# Patient Record
Sex: Male | Born: 1985 | Race: White | Hispanic: No | Marital: Single | Smoking: Former smoker
Health system: Southern US, Community
[De-identification: ages and names within clinical notes are randomized; demographics above are authoritative.]

---

## 2006-12-07 HISTORY — PX: KNEE ARTHROSCOPY W/ MENISCAL REPAIR: SHX1877

## 2012-08-27 ENCOUNTER — Emergency Department (HOSPITAL_COMMUNITY): Payer: BC Managed Care – PPO

## 2012-08-27 ENCOUNTER — Emergency Department (HOSPITAL_COMMUNITY)
Admission: EM | Admit: 2012-08-27 | Discharge: 2012-08-27 | Disposition: A | Discharge status: Home / Self Care | Payer: BC Managed Care – PPO | Attending: Emergency Medicine | Admitting: Emergency Medicine

## 2012-08-27 ENCOUNTER — Encounter (HOSPITAL_COMMUNITY): Payer: Self-pay | Admitting: Emergency Medicine

## 2012-08-27 DIAGNOSIS — Z87891 Personal history of nicotine dependence: Secondary | ICD-10-CM | POA: Insufficient documentation

## 2012-08-27 DIAGNOSIS — M25561 Pain in right knee: Secondary | ICD-10-CM

## 2012-08-27 DIAGNOSIS — M1710 Unilateral primary osteoarthritis, unspecified knee: Secondary | ICD-10-CM | POA: Insufficient documentation

## 2012-08-27 LAB — CBC WITH DIFFERENTIAL/PLATELET
Basophils Absolute: 0.1 10*3/uL (ref 0.0–0.1)
Eosinophils Absolute: 0.1 10*3/uL (ref 0.0–0.7)
Eosinophils Relative: 1 % (ref 0–5)
HCT: 39.4 % (ref 39.0–52.0)
Lymphocytes Relative: 26 % (ref 12–46)
MCH: 29.3 pg (ref 26.0–34.0)
MCHC: 33.8 g/dL (ref 30.0–36.0)
MCV: 86.8 fL (ref 78.0–100.0)
Monocytes Absolute: 0.8 10*3/uL (ref 0.1–1.0)
Platelets: 298 10*3/uL (ref 150–400)
RDW: 12.1 % (ref 11.5–15.5)
WBC: 7.8 10*3/uL (ref 4.0–10.5)

## 2012-08-27 NOTE — ED Notes (Signed)
Pt presents w/ left knee pain onset 2 wks ago. Pain has gotten worse and knee is now swollen. Denies new injury but is active on his legs. Pt endorses hx of left knee infection 2.5 yrs ago Dx at student center at [employer], specimen was lost, treated like it was "STD". Pt was hospitalized at [facility] for 1 wk and then received home IV antibx after discharge.States recently tested for STDs and was "clear"

## 2012-08-27 NOTE — ED Notes (Signed)
Pt states he has pain that comes from L lateral thigh and "shoots into my groin". Pt states at times the pain moves from calf to thigh to groin and feels "tight". Denies recent injury or falls, + pedal and femoral pulses, no redness or heat to extremity.

## 2012-08-28 NOTE — ED Provider Notes (Signed)
History     CSN: [id number]  Arrival date & time 08/27/12  1613   First MD Initiated Contact with Patient 08/27/12 1824      Chief Complaint  Patient presents with  . Knee Pain    (Consider location/radiation/quality/duration/timing/severity/associated sxs/prior treatment) HPI  26 year old male in no acute distress complaining of left knee pain worsening over the course of 2 weeks. He denies any injury but he is a very active and athletic person. Patient had a similar episode 2 and half years ago at that time, the pain was accompanied by conjunctivitis and urethral discharge. Patient was never diagnosed with a septic arthritis, however he was treated for STDs at that time. Patient currently denies any fever, conjunctivitis or urethral discharge, any recent history of unprotected sex. Patient states he was tested for STDs at his school approximately one half weeks ago when everything came back negative. Pain is moderate at 6/10, exacerbated by weightbearing, non-radiating. Patient also had arthroscopy with meniscal repair in 2008 at the same knee.  History reviewed. No pertinent past medical history.  Past Surgical History  Procedure Date  . Knee arthroscopy w/ meniscal repair 2008    left knee    No family history on file.  History  Substance Use Topics  . Smoking status: Former Games developer  . Smokeless tobacco: Former Neurosurgeon    Types: Chew  . Alcohol Use: 2.4 oz/week    4 Cans of beer per week      Review of Systems  Constitutional: Negative for fever.  Eyes: Negative for discharge, redness and itching.  Respiratory: Negative for shortness of breath.   Cardiovascular: Negative for chest pain.  Gastrointestinal: Negative for nausea, vomiting, abdominal pain and diarrhea.  Genitourinary: Negative for dysuria, frequency, discharge, penile swelling, genital sores, penile pain and testicular pain.  Musculoskeletal: Positive for arthralgias.  All other systems reviewed and are  negative.    Allergies  Pertussis vaccines  Home Medications  No current outpatient prescriptions on file.  BP 120/56  Pulse 56  Temp 99.3 F (37.4 C) (Oral)  Resp 16  SpO2 100%  Physical Exam  Nursing note and vitals reviewed. Constitutional: He is oriented to person, place, and time. He appears well-developed and well-nourished. No distress.  HENT:  Head: Normocephalic.  Mouth/Throat: Oropharynx is clear and moist.  Eyes: Conjunctivae normal and EOM are normal.  Cardiovascular: Normal rate and intact distal pulses.   Pulmonary/Chest: Effort normal and breath sounds normal. No stridor.  Abdominal: Soft. Bowel sounds are normal.  Musculoskeletal: Normal range of motion.       Left Knee: No deformity, no warmth, erythema or abrasions. FROM. No effusion or crepitance. Anterior and posterior drawer show no abnormal laxity. Stable to valgus and varus stress. Joint lines are non-tender. Neurovascularly intact. Pt ambulates with non-antalgic gait.    Neurological: He is alert and oriented to person, place, and time.  Psychiatric: He has a normal mood and affect.    ED Course  Procedures (including critical care time)   Labs Reviewed  CBC WITH DIFFERENTIAL  LAB REPORT - SCANNED   Dg Knee Complete 4 Views Left  08/27/2012  *RADIOLOGY REPORT*  Clinical Data: Knee pain  LEFT KNEE - COMPLETE 4+ VIEW  Comparison: None.  Findings: Five views of the left knee submitted.  No acute fracture or subluxation.  No radiopaque foreign body.  No joint effusion.  IMPRESSION: No acute fracture or subluxation.   Original Report Authenticated By: Natasha Mead, M.D.  1. Arthralgia of knee, right       MDM  Very unlikely septic arthritis: Negative STD screen, knee is not warm no significant effusion is noted, no white blood cell count.    Patient has primary care at he'll family practice will instruct him to follow with her. Discussed case with attending Dr. Effie Shy who agrees with care plan  and stability for discharge   Pt verbalized understanding and agrees with care plan. Outpatient follow-up and return precautions given.        Joni Reining Deroy Noah, PA-C 08/29/12 0009

## 2012-08-30 NOTE — ED Provider Notes (Signed)
Medical screening examination/treatment/procedure(s) were performed by non-physician practitioner and as supervising physician I was immediately available for consultation/collaboration.  Zoe Goonan L Alecxis Baltzell, MD 08/30/12 2044 

## 2018-08-03 ENCOUNTER — Other Ambulatory Visit: Payer: Self-pay

## 2018-08-03 ENCOUNTER — Emergency Department (HOSPITAL_COMMUNITY)
Admission: EM | Admit: 2018-08-03 | Discharge: 2018-08-03 | Disposition: A | Discharge status: Home / Self Care | Payer: PRIVATE HEALTH INSURANCE | Attending: Emergency Medicine | Admitting: Emergency Medicine

## 2018-08-03 ENCOUNTER — Encounter (HOSPITAL_COMMUNITY): Payer: Self-pay | Admitting: *Deleted

## 2018-08-03 ENCOUNTER — Emergency Department (HOSPITAL_COMMUNITY): Payer: PRIVATE HEALTH INSURANCE

## 2018-08-03 DIAGNOSIS — Y33XXXA Other specified events, undetermined intent, initial encounter: Secondary | ICD-10-CM | POA: Diagnosis not present

## 2018-08-03 DIAGNOSIS — Y998 Other external cause status: Secondary | ICD-10-CM | POA: Insufficient documentation

## 2018-08-03 DIAGNOSIS — S93401A Sprain of unspecified ligament of right ankle, initial encounter: Secondary | ICD-10-CM | POA: Diagnosis not present

## 2018-08-03 DIAGNOSIS — Z87891 Personal history of nicotine dependence: Secondary | ICD-10-CM | POA: Diagnosis not present

## 2018-08-03 DIAGNOSIS — Y939 Activity, unspecified: Secondary | ICD-10-CM | POA: Diagnosis not present

## 2018-08-03 DIAGNOSIS — S99911A Unspecified injury of right ankle, initial encounter: Secondary | ICD-10-CM | POA: Diagnosis present

## 2018-08-03 DIAGNOSIS — Y929 Unspecified place or not applicable: Secondary | ICD-10-CM | POA: Insufficient documentation

## 2018-08-03 MED ORDER — OXYCODONE-ACETAMINOPHEN 5-325 MG PO TABS
1.0000 | ORAL_TABLET | Freq: Once | ORAL | Status: AC
  Administered 2018-08-03: 1 via ORAL
  Filled 2018-08-03: qty 1

## 2018-08-03 NOTE — ED Triage Notes (Signed)
Last Friday a week ago rolled ankle, had x rays  Due to not being able to walk the next day. Wa told to take Ibuprofen, not getting any better.

## 2018-08-03 NOTE — ED Notes (Signed)
Bed: WTR5 Expected date:  Expected time:  Means of arrival:  Comments: 

## 2018-08-03 NOTE — ED Provider Notes (Signed)
[facility]-EMERGENCY DEPT Provider Note   CSN: [id number] Arrival date & time: 08/03/18  1119     History   Chief Complaint Chief Complaint  Patient presents with  . Ankle Pain    Rt    HPI Shirlee LatchLuke T Caloca is a 32 y.o. male.  HPI Patient presents with right ankle and foot pain.  Around 5 days ago he rolled his right ankle.  States he does not remember exactly what happened.  [facility] that was [facility] negative.  Has had continued pain.  Swelling is improved somewhat but continued pain is having difficulty walking on it.  States he went back to work and the pain increased.  No other injury.  Patient wonders about an MRI for his ankle. History reviewed. No pertinent past medical history.  There are no active problems to display for this patient.   Past Surgical History:  Procedure Laterality Date  . KNEE ARTHROSCOPY W/ MENISCAL REPAIR  2008   left knee        Home Medications    Prior to Admission medications   Medication Sig Start Date End Date Taking? Authorizing Provider  ibuprofen (ADVIL,MOTRIN) 200 MG tablet Take 200-400 mg by mouth every 6 (six) hours as needed for mild pain.   Yes [provider]  OVER THE COUNTER MEDICATION Take 1 capsule by mouth daily as needed (cat allergies).   Yes [provider]    Family History No family history on file.  Social History Social History   Tobacco Use  . Smoking status: Former Games developermoker  . Smokeless tobacco: Former NeurosurgeonUser    Types: Chew  Substance Use Topics  . Alcohol use: Yes    Alcohol/week: 4.0 standard drinks    Types: 4 Cans of beer per week  . Drug use: Yes    Frequency: 1.0 times per week    Types: Marijuana     Allergies   Pertussis vaccines   Review of Systems Review of Systems  Constitutional: Negative for appetite change.  Musculoskeletal:       Right ankle pain and swelling.  Skin: Negative for wound.  Neurological: Negative for  weakness and numbness.     Physical Exam Updated Vital Signs BP (!) 130/93 (BP Location: Left Arm)   Pulse (!) 103   Temp 98.2 F (36.8 C) (Oral)   Resp 16   Ht 5\' 7"  (1.702 m)   Wt 77.1 kg   SpO2 100%   BMI 26.63 kg/m   Physical Exam  Constitutional: He appears well-developed.  HENT:  Head: Atraumatic.  Cardiovascular: Normal rate.  Musculoskeletal: He exhibits tenderness.  Tenderness over right ankle medially.  Tender over medial malleolus with some swelling inferior to this.  No tenderness over proximal lower leg.  Skin intact.  Sensation intact distally.  Ankle appears stable.  Mild tenderness over midfoot.  Neurological: He is alert.     ED Treatments / Results  Labs (all labs ordered are listed, but only abnormal results are displayed) Labs Reviewed - No data to display  EKG None  Radiology Dg Ankle Complete Right  Result Date: 08/03/2018 CLINICAL DATA:  RIGHT foot and ankle injury approximately 1 week ago, persistent pain and swelling unrelieved with ibuprofen. Subsequent encounter. EXAM: RIGHT ANKLE - COMPLETE 3+ VIEW COMPARISON:  None. FINDINGS: Mild diffuse soft tissue swelling. No evidence of acute or subacute fracture. Ankle mortise intact with well-preserved joint space. Well-preserved bone mineral density. Benign bone  island in the talus. Small joint effusion. IMPRESSION: No acute or or subacute osseous abnormality.  Small joint effusion. Electronically Signed   By: Hulan Saas M.D.   On: 08/03/2018 12:32   Dg Foot Complete Right  Result Date: 08/03/2018 CLINICAL DATA:  RIGHT foot and ankle injury approximately 1 week ago, persistent pain and swelling unrelieved with ibuprofen. Subsequent encounter. EXAM: RIGHT FOOT COMPLETE - 3+ VIEW COMPARISON:  None. FINDINGS: Possible subacute or remote avulsion fracture arising from the head of the proximal phalanx of the fifth toe. No fractures elsewhere. Well-preserved joint spaces. Well-preserved bone mineral  density. Small plantar calcaneal spur. IMPRESSION: Possible subacute or remote avulsion fracture arising from the head of the proximal phalanx of the fifth toe. Please correlate with point tenderness. No fractures elsewhere. Small plantar calcaneal spur. Electronically Signed   By: Hulan Saas M.D.   On: 08/03/2018 12:35    Procedures Procedures (including critical care time)  Medications Ordered in ED Medications  oxyCODONE-acetaminophen (PERCOCET/ROXICET) 5-325 MG per tablet 1 tablet (1 tablet Oral Given 08/03/18 1339)     Initial Impression / Assessment and Plan / ED Course  I have reviewed the triage vital signs and the nursing notes.  Pertinent labs & imaging results that were available during my care of the patient were reviewed by me and considered in my medical decision making (see chart for details).     Patient with a right ankle injury.  Reported negative x-ray but with continued pain after a week it was repeated.  Still no bony injury seen.  Patient has a splint/immobilizer at home.  Given crutches and will follow-up with orthopedic surgery.  Final Clinical Impressions(s) / ED Diagnoses   Final diagnoses:  Sprain of right ankle, unspecified ligament, initial encounter    ED Discharge Orders    None       Benjiman Core, MD 08/04/18 1557

## 2018-08-11 ENCOUNTER — Encounter (INDEPENDENT_AMBULATORY_CARE_PROVIDER_SITE_OTHER): Payer: Self-pay | Admitting: Orthopaedic Surgery

## 2018-08-11 ENCOUNTER — Ambulatory Visit (INDEPENDENT_AMBULATORY_CARE_PROVIDER_SITE_OTHER): Payer: PRIVATE HEALTH INSURANCE | Admitting: Orthopaedic Surgery

## 2018-08-11 DIAGNOSIS — S93411A Sprain of calcaneofibular ligament of right ankle, initial encounter: Secondary | ICD-10-CM | POA: Diagnosis not present

## 2018-08-11 NOTE — Progress Notes (Signed)
Office Visit Note   Patient: Devon Tanner           Date of Birth: 03/04/1986           MRN: [id number] Visit Date: 08/11/2018              Requested by: No referring provider defined for this encounter. PCP: Patient, No Pcp Per   Assessment & Plan: Visit Diagnoses:  1. Sprain of calcaneofibular ligament of right ankle, initial encounter     Plan: The patient understands this is quite a severe sprain and can take 4 to 6 weeks before he feels better.  He is to work on ice and elevation and can weight-bear as tolerated using his brace or boot.  We will give him a note to keep him out of work at least the next 2 weeks.  I would like to see him back in 2 weeks for repeat exam.  If his symptoms not improving and he has significant pain on exam we may consider repeat 3 views of his right ankle.  All question concerns were answered and addressed.  Follow-Up Instructions: Return in about 2 weeks (around 08/25/2018).   Orders:  No orders of the defined types were placed in this encounter.  No orders of the defined types were placed in this encounter.     Procedures: No procedures performed   Clinical Data: No additional findings.   Subjective: Chief Complaint  Patient presents with  . Right Ankle - Pain  The patient is a 32 year old gentleman referred to the emergency room after sustaining a sprain to his right ankle about 2 weeks ago.  He rolled on that ankle and his pain worsened over the weekend.  He had difficulty ambulating on the ankle as well with swelling medially and laterally.  In the emergency room x-rays were obtained were negative for fracture.  He does have 2 different types of braces is on crutches.  He does work at [employer] is on his feet quite a bit he cannot do that job right now.  Denies any numbness and tingling or any injuries otherwise.  His father is with him today.  HPI  Review of Systems He currently denies any headache, chest pain, shortness of  breath, fever, chills, nausea, vomiting.  Objective: Vital Signs: There were no vitals taken for this visit.  Physical Exam He is alert and oriented x3 and in no acute distress Ortho Exam Examination of his right ankle shows that is clinically well located.  There is lateral and medial swelling but no bruising.  His foot is neurovascular intact.  He has pain over the anterior talofibular ligament as well as the deltoid ligament. Specialty Comments:  No specialty comments available.  Imaging: No results found. X-rays independently reviewed of the right ankle show no obvious fracture but soft tissue swelling medially and laterally.  The ankle mortise is intact.  PMFS History: There are no active problems to display for this patient.  History reviewed. No pertinent past medical history.  History reviewed. No pertinent family history.  Past Surgical History:  Procedure Laterality Date  . KNEE ARTHROSCOPY W/ MENISCAL REPAIR  2008   left knee   Social History   Occupational History  . Not on file  Tobacco Use  . Smoking status: Former Games developer  . Smokeless tobacco: Former Neurosurgeon    Types: Chew  Substance and Sexual Activity  . Alcohol use: Yes    Alcohol/week: 4.0 standard drinks  Types: 4 Cans of beer per week  . Drug use: Yes    Frequency: 1.0 times per week    Types: Marijuana  . Sexual activity: Yes    Birth control/protection: Condom

## 2018-08-25 ENCOUNTER — Encounter (INDEPENDENT_AMBULATORY_CARE_PROVIDER_SITE_OTHER): Payer: Self-pay | Admitting: Orthopaedic Surgery

## 2018-08-25 ENCOUNTER — Ambulatory Visit (INDEPENDENT_AMBULATORY_CARE_PROVIDER_SITE_OTHER): Payer: PRIVATE HEALTH INSURANCE

## 2018-08-25 ENCOUNTER — Ambulatory Visit (INDEPENDENT_AMBULATORY_CARE_PROVIDER_SITE_OTHER): Payer: PRIVATE HEALTH INSURANCE | Admitting: Orthopaedic Surgery

## 2018-08-25 DIAGNOSIS — S93411D Sprain of calcaneofibular ligament of right ankle, subsequent encounter: Secondary | ICD-10-CM

## 2018-08-25 NOTE — Progress Notes (Signed)
The patient is now between 4 and 5 weeks into a significant right ankle injury.  We felt this was just a sprain but he still hopping around on crutches and walking with a cam walker and sometimes an ASO.  He still experiencing global swelling in his right ankle as well.  He has had plain films before that were negative for fracture.  On exam he does have global tenderness around his right ankle with swelling medially and laterally and definitely a mild ankle effusion.  He has good range of motion of the ankle but is tender and stiff.  3 views of the right ankle today showed no gross abnormalities there is soft tissue swelling.  At this point based on the amount of swelling and especially the ankle fusion is having I am concerned about an osteochondral defect or a missed fracture.  At this point since is been 4 to 5 weeks an MRI is warranted to assess the cartilage and bones of the ankle as well as the tenderness and ligamentous structures.  I am going to give him a note to keep him out of work as well.  He will continue his crutches and weightbearing as tolerated in cam walking boot.  We will see him back once the MRI is obtained of his right ankle.  All question concerns were answered and addressed.

## 2018-08-29 ENCOUNTER — Other Ambulatory Visit (INDEPENDENT_AMBULATORY_CARE_PROVIDER_SITE_OTHER): Payer: Self-pay | Admitting: Orthopaedic Surgery

## 2018-08-29 DIAGNOSIS — S93411D Sprain of calcaneofibular ligament of right ankle, subsequent encounter: Secondary | ICD-10-CM

## 2018-09-08 ENCOUNTER — Ambulatory Visit (INDEPENDENT_AMBULATORY_CARE_PROVIDER_SITE_OTHER): Payer: PRIVATE HEALTH INSURANCE | Admitting: Orthopaedic Surgery

## 2018-09-10 ENCOUNTER — Ambulatory Visit
Admission: RE | Admit: 2018-09-10 | Discharge: 2018-09-10 | Disposition: A | Discharge status: Home / Self Care | Payer: PRIVATE HEALTH INSURANCE | Source: Ambulatory Visit | Attending: Orthopaedic Surgery | Admitting: Orthopaedic Surgery

## 2018-09-10 DIAGNOSIS — S93411D Sprain of calcaneofibular ligament of right ankle, subsequent encounter: Secondary | ICD-10-CM

## 2018-09-14 ENCOUNTER — Ambulatory Visit (INDEPENDENT_AMBULATORY_CARE_PROVIDER_SITE_OTHER): Payer: PRIVATE HEALTH INSURANCE | Admitting: Physician Assistant

## 2018-09-14 ENCOUNTER — Encounter (INDEPENDENT_AMBULATORY_CARE_PROVIDER_SITE_OTHER): Payer: Self-pay | Admitting: Physician Assistant

## 2018-09-14 VITALS — Ht 67.0 in | Wt 160.0 lb

## 2018-09-14 DIAGNOSIS — M25571 Pain in right ankle and joints of right foot: Secondary | ICD-10-CM

## 2018-09-14 NOTE — Progress Notes (Signed)
Office Visit Note   Patient: Devon Tanner           Date of Birth: 09-01-1986           MRN: [id number] Visit Date: 09/14/2018              Requested by: No referring provider defined for this encounter. PCP: Patient, No Pcp Per   Assessment & Plan: Visit Diagnoses: No diagnosis found.  Plan: Discussed with loop that this MRI mostly showed bony bruising ankle sprain and some tenosynovitis.  He does not need any type of surgical intervention at this point in time.  Recommend conservative treatment.  However discussed with him that this may take several months to totally resolve 3-6 in all.  I explained to the patient like for him to go into the ASO brace when up ambulating.  He is weightbearing as tolerated with the ASO brace on.  He is to work on range of motion of the ankle and foot.  Encouraged elevation and ice.  Follow-Up Instructions: Return in about 2 months (around 11/14/2018).   Orders:  No orders of the defined types were placed in this encounter.  No orders of the defined types were placed in this encounter.     Procedures: No procedures performed   Clinical Data: No additional findings.   Subjective: Chief Complaint  Patient presents with  . Right Ankle - Follow-up    MRI Right Ankle Review    HPI Devon Tanner returns today 6 weeks 5 days status post right ankle injury.  Is here to review the MRI of his ankle.  He states overall he is improving but continues to have sharp pain if he is standing and bearing weight on the foot and ankle.  He has pain in the posterior aspect of the heel and the anterior aspect of the ankle.  He is using crutches.  He is taken Tylenol.  He has had no shortness of breath fevers or chills. MRI of the right ankle is reviewed with the patient actual images are shown to the patient and the ankle model was used for further visualization.  Right ankle MRI dated 09/10/2018 showed marrow edema involving the neck of the talus as well as the posterior  medial navicular bone which could indicate a subacute nondisplaced fracture.  Marrow edema of the lateral and medial malleoli and also around the subtalar joint.  Mild tenosynovitis of the posterior medial peroneal and anterior tibial tendons.  Torn appearance of the anterior tibiofibular ligament however the tendon appears intact.  Bursitis retrocalcaneal region.  Nondisplaced fracture versus type II accessory navicular.  Review of Systems See HPI Objective: Vital Signs: Ht 5\' 7"  (1.702 m)   Wt 160 lb (72.6 kg)   BMI 25.06 kg/m   Physical Exam  Constitutional: He appears well-developed and well-nourished. No distress.  Cardiovascular: Intact distal pulses.  Neurological: He is alert.  Skin: He is not diaphoretic.    Ortho Exam Right ankle Global swelling.  He has tenderness over the anterior talofibular ligament region.  No tenderness over the posterior tibial tendon or peroneal tendons.  He has tenderness over the insertion region of the posterior tibial tendon at the end of the navicular region.  Has tenderness at the posterior aspect of the calcaneus no tenderness over the Achilles.  Calf supple nontender.  Medial tubercle calcaneus is non-tender.  He is able to invert and evert the foot without significant pain. Specialty Comments:  No specialty comments available.  Imaging: No results found.   PMFS History: There are no active problems to display for this patient.  No past medical history on file.  No family history on file.  Past Surgical History:  Procedure Laterality Date  . KNEE ARTHROSCOPY W/ MENISCAL REPAIR  2008   left knee   Social History   Occupational History  . Not on file  Tobacco Use  . Smoking status: Former Games developer  . Smokeless tobacco: Former Neurosurgeon    Types: Chew  Substance and Sexual Activity  . Alcohol use: Yes    Alcohol/week: 4.0 standard drinks    Types: 4 Cans of beer per week  . Drug use: Yes    Frequency: 1.0 times per week    Types:  Marijuana  . Sexual activity: Yes    Birth control/protection: Condom

## 2018-11-14 ENCOUNTER — Ambulatory Visit (INDEPENDENT_AMBULATORY_CARE_PROVIDER_SITE_OTHER): Payer: PRIVATE HEALTH INSURANCE | Admitting: Physician Assistant

## 2018-12-12 ENCOUNTER — Emergency Department (HOSPITAL_COMMUNITY)
Admission: EM | Admit: 2018-12-12 | Discharge: 2018-12-12 | Disposition: A | Discharge status: Home / Self Care | Payer: PRIVATE HEALTH INSURANCE | Attending: Emergency Medicine | Admitting: Emergency Medicine

## 2018-12-12 ENCOUNTER — Encounter (HOSPITAL_COMMUNITY): Payer: Self-pay | Admitting: Emergency Medicine

## 2018-12-12 DIAGNOSIS — Z79899 Other long term (current) drug therapy: Secondary | ICD-10-CM | POA: Insufficient documentation

## 2018-12-12 DIAGNOSIS — R05 Cough: Secondary | ICD-10-CM | POA: Diagnosis present

## 2018-12-12 DIAGNOSIS — J09X2 Influenza due to identified novel influenza A virus with other respiratory manifestations: Secondary | ICD-10-CM | POA: Insufficient documentation

## 2018-12-12 DIAGNOSIS — J101 Influenza due to other identified influenza virus with other respiratory manifestations: Secondary | ICD-10-CM

## 2018-12-12 DIAGNOSIS — Z87891 Personal history of nicotine dependence: Secondary | ICD-10-CM | POA: Insufficient documentation

## 2018-12-12 LAB — INFLUENZA PANEL BY PCR (TYPE A & B)
INFLBPCR: NEGATIVE
Influenza A By PCR: POSITIVE — AB

## 2018-12-12 MED ORDER — FLUTICASONE PROPIONATE 50 MCG/ACT NA SUSP: 1.0000 | Freq: Every day | NASAL | 2 refills | Status: AC

## 2018-12-12 MED ORDER — BENZONATATE 100 MG PO CAPS: 100.0000 mg | ORAL_CAPSULE | Freq: Three times a day (TID) | ORAL | 0 refills | Status: AC

## 2018-12-12 MED ORDER — OSELTAMIVIR PHOSPHATE 75 MG PO CAPS: 75.0000 mg | ORAL_CAPSULE | Freq: Two times a day (BID) | ORAL | 0 refills | Status: AC

## 2018-12-12 NOTE — ED Triage Notes (Signed)
Pt c/o body aches, sore throat, cough, ear pains and congestion for 3 days.

## 2018-12-12 NOTE — Discharge Instructions (Signed)
We are treating you for influenza A. Be sure you are drinking plenty of fluids so you don't get dehydrated. Follow up with your doctor or return here for worsening symptoms.

## 2018-12-12 NOTE — ED Provider Notes (Signed)
[facility]-EMERGENCY DEPT Provider Note   CSN: [id number] Arrival date & time: 12/12/18  1524     History   Chief Complaint Chief Complaint  Patient presents with  . Generalized Body Aches  . Cough  . Sore Throat  . Otalgia    HPI Devon Tanner is a 33 y.o. male who presents to the ED with c/o flu like symptoms that started 2 days ago. Patient did not have flu shot this year.   The history is provided by the patient. No language interpreter was used.  Sore Throat  Associated symptoms include headaches. Pertinent negatives include no abdominal pain. Chest pain: with cough.  Otalgia  Associated symptoms: congestion, cough, fever, headaches and sore throat   Associated symptoms: no abdominal pain, no diarrhea, no rash and no vomiting   Influenza  Presenting symptoms: cough, fatigue, fever, headache, myalgias and sore throat   Presenting symptoms: no diarrhea and no vomiting   Severity:  Moderate Onset quality:  Gradual Duration:  2 days Progression:  Unchanged Relieved by:  Nothing Worsened by:  Nothing Ineffective treatments:  OTC medications Associated symptoms: chills, decreased appetite, ear pain and nasal congestion   Associated symptoms: no neck stiffness and no syncope   Risk factors: no sick contacts     History reviewed. No pertinent past medical history.  There are no active problems to display for this patient.   Past Surgical History:  Procedure Laterality Date  . KNEE ARTHROSCOPY W/ MENISCAL REPAIR  2008   left knee        Home Medications    Prior to Admission medications   Medication Sig Start Date End Date Taking? Authorizing Provider  acetaminophen (TYLENOL) 500 MG tablet Take 500 mg by mouth daily as needed.    [provider]  benzonatate (TESSALON) 100 MG capsule Take 1 capsule (100 mg total) by mouth every 8 (eight) hours. 12/12/18   Janne Napoleon, NP  fluticasone (FLONASE) 50 MCG/ACT nasal spray Place 1 spray  into both nostrils daily. 12/12/18   Janne Napoleon, NP  ibuprofen (ADVIL,MOTRIN) 200 MG tablet Take 200-400 mg by mouth every 6 (six) hours as needed for mild pain.    [provider]  oseltamivir (TAMIFLU) 75 MG capsule Take 1 capsule (75 mg total) by mouth every 12 (twelve) hours. 12/12/18   Janne Napoleon, NP  OVER THE COUNTER MEDICATION Take 1 capsule by mouth daily as needed (cat allergies).    [provider]    Family History No family history on file.  Social History Social History   Tobacco Use  . Smoking status: Former Games developer  . Smokeless tobacco: Former Neurosurgeon    Types: Chew  Substance Use Topics  . Alcohol use: Yes    Alcohol/week: 4.0 standard drinks    Types: 4 Cans of beer per week  . Drug use: Yes    Frequency: 1.0 times per week    Types: Marijuana     Allergies   Pertussis vaccines   Review of Systems Review of Systems  Constitutional: Positive for chills, decreased appetite, fatigue and fever.  HENT: Positive for congestion, ear pain, sinus pressure and sore throat.   Eyes: Negative for discharge, redness and itching.  Respiratory: Positive for cough.   Cardiovascular: Chest pain: with cough.  Gastrointestinal: Negative for abdominal pain, diarrhea and vomiting.  Genitourinary: Negative for dysuria, frequency and urgency.  Musculoskeletal: Positive for myalgias. Negative for neck stiffness.  Skin: Negative for  rash.  Neurological: Positive for headaches. Negative for syncope.  Psychiatric/Behavioral: Negative for confusion.     Physical Exam Updated Vital Signs BP 124/83 (BP Location: Right Arm)   Pulse 92   Temp 98.3 F (36.8 C) (Oral)   Resp 18   SpO2 97%   Physical Exam Vitals signs and nursing note reviewed.  Constitutional:      General: He is not in acute distress.    Appearance: He is well-developed.  HENT:     Head: Normocephalic and atraumatic.     Right Ear: Tympanic membrane normal.     Left Ear: Tympanic membrane  normal.     Mouth/Throat:     Mouth: Mucous membranes are moist.     Pharynx: Uvula midline. No pharyngeal swelling or oropharyngeal exudate.  Eyes:     Extraocular Movements: Extraocular movements intact.     Conjunctiva/sclera: Conjunctivae normal.  Neck:     Musculoskeletal: Normal range of motion and neck supple. No neck rigidity.  Cardiovascular:     Rate and Rhythm: Normal rate and regular rhythm.  Pulmonary:     Effort: Pulmonary effort is normal.     Breath sounds: No wheezing, rhonchi or rales.  Abdominal:     Palpations: Abdomen is soft.     Tenderness: There is no abdominal tenderness.  Musculoskeletal: Normal range of motion.  Skin:    General: Skin is warm and dry.  Neurological:     Mental Status: He is alert and oriented to person, place, and time.  Psychiatric:        Mood and Affect: Mood normal.      ED Treatments / Results  Labs (all labs ordered are listed, but only abnormal results are displayed) Labs Reviewed  INFLUENZA PANEL BY PCR (TYPE A & B) - Abnormal; Notable for the following components:      Result Value   Influenza A By PCR POSITIVE (*)    All other components within normal limits    Radiology No results found.  Procedures Procedures (including critical care time)  Medications Ordered in ED Medications - No data to display   Initial Impression / Assessment and Plan / ED Course  I have reviewed the triage vital signs and the nursing notes. SUBJECTIVE:  Devon Tanner is a 33 y.o. male who present complaining of flu-like symptoms: fevers, chills, myalgias, congestion, sore throat and cough for 2 days. Denies dyspnea or wheezing.  OBJECTIVE: Appears moderately ill but not toxic; temperature as noted in vitals. Ears normal. Throat and pharynx normal.  Neck supple. No adenopathy in the neck. Sinuses non tender. The chest is clear.  ASSESSMENT: Influenza A  PLAN: Symptomatic therapy suggested: rest, increase fluids, gargle prn for  sore throat, use mist of vaporizer prn. Take the medications as directed and f/u with PCP  Final Clinical Impressions(s) / ED Diagnoses   Final diagnoses:  Influenza A    ED Discharge Orders         Ordered    oseltamivir (TAMIFLU) 75 MG capsule  Every 12 hours     12/12/18 1701    benzonatate (TESSALON) 100 MG capsule  Every 8 hours     12/12/18 1701    fluticasone (FLONASE) 50 MCG/ACT nasal spray  Daily     12/12/18 1701           Damian Leavell [place], NP 12/12/18 1708    Little, Ambrose [place], MD 12/13/18 0009

## 2019-05-14 IMAGING — CR DG FOOT COMPLETE 3+V*R*
3 series · 3 of 3 positions shown · non-contrast
Comparison: None.

CLINICAL DATA: RIGHT foot and ankle injury approximately 1 week
ago, persistent pain and swelling unrelieved with ibuprofen.
Subsequent encounter.

EXAM:
RIGHT FOOT COMPLETE - 3+ VIEW

[x foot ap right]
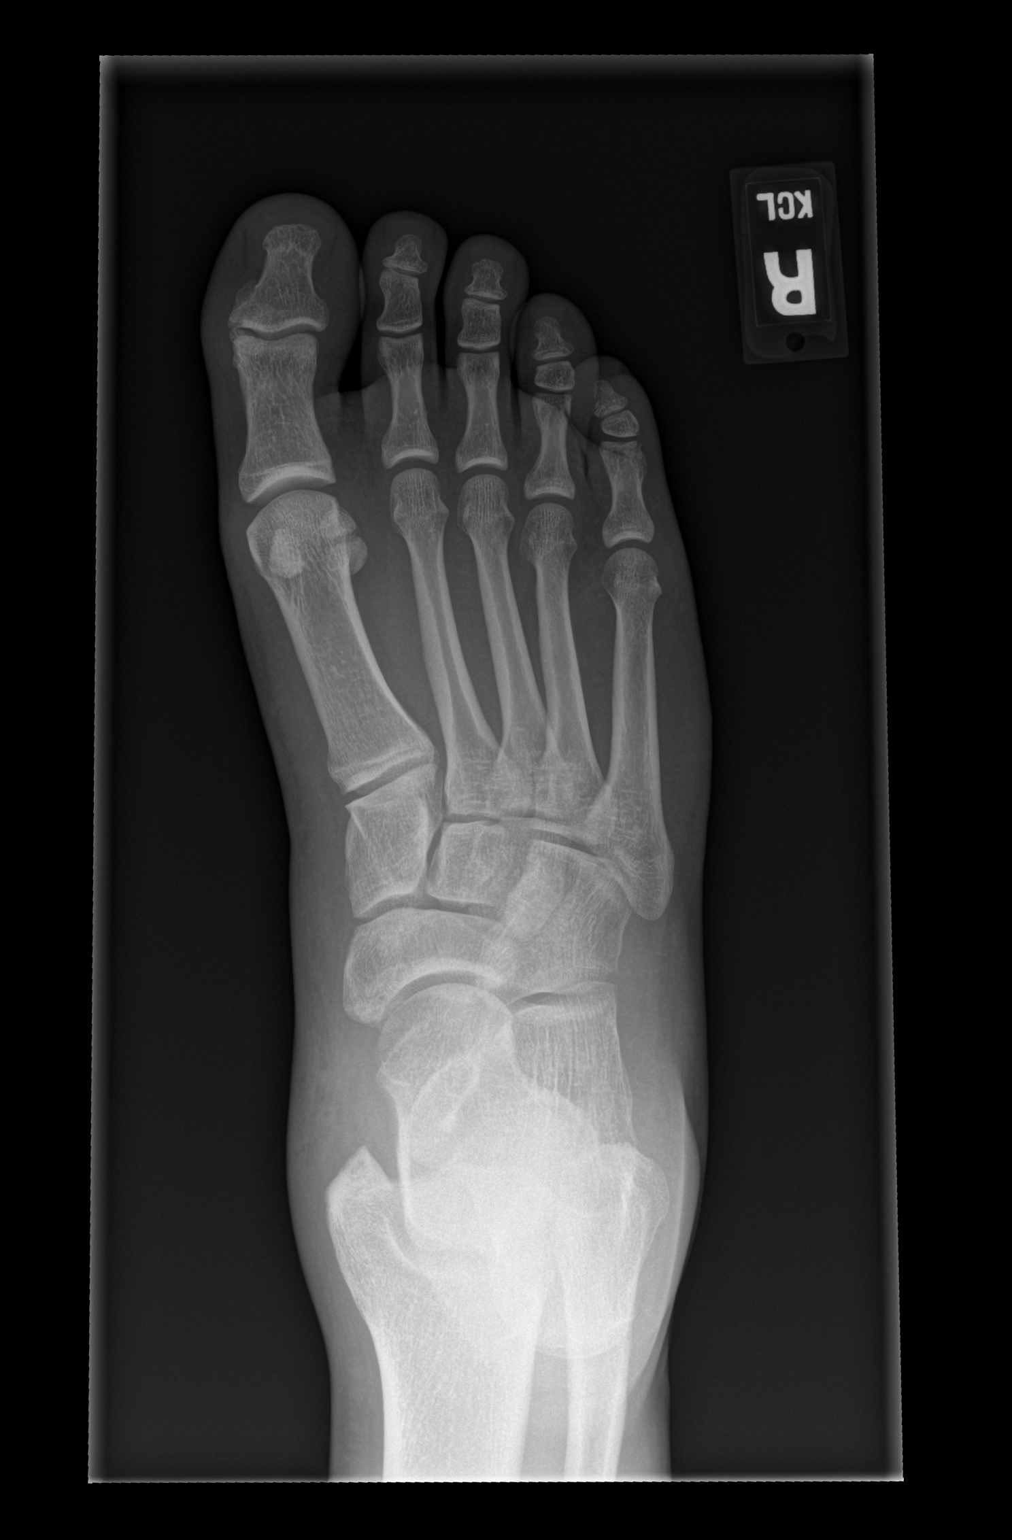

[x foot obl right]
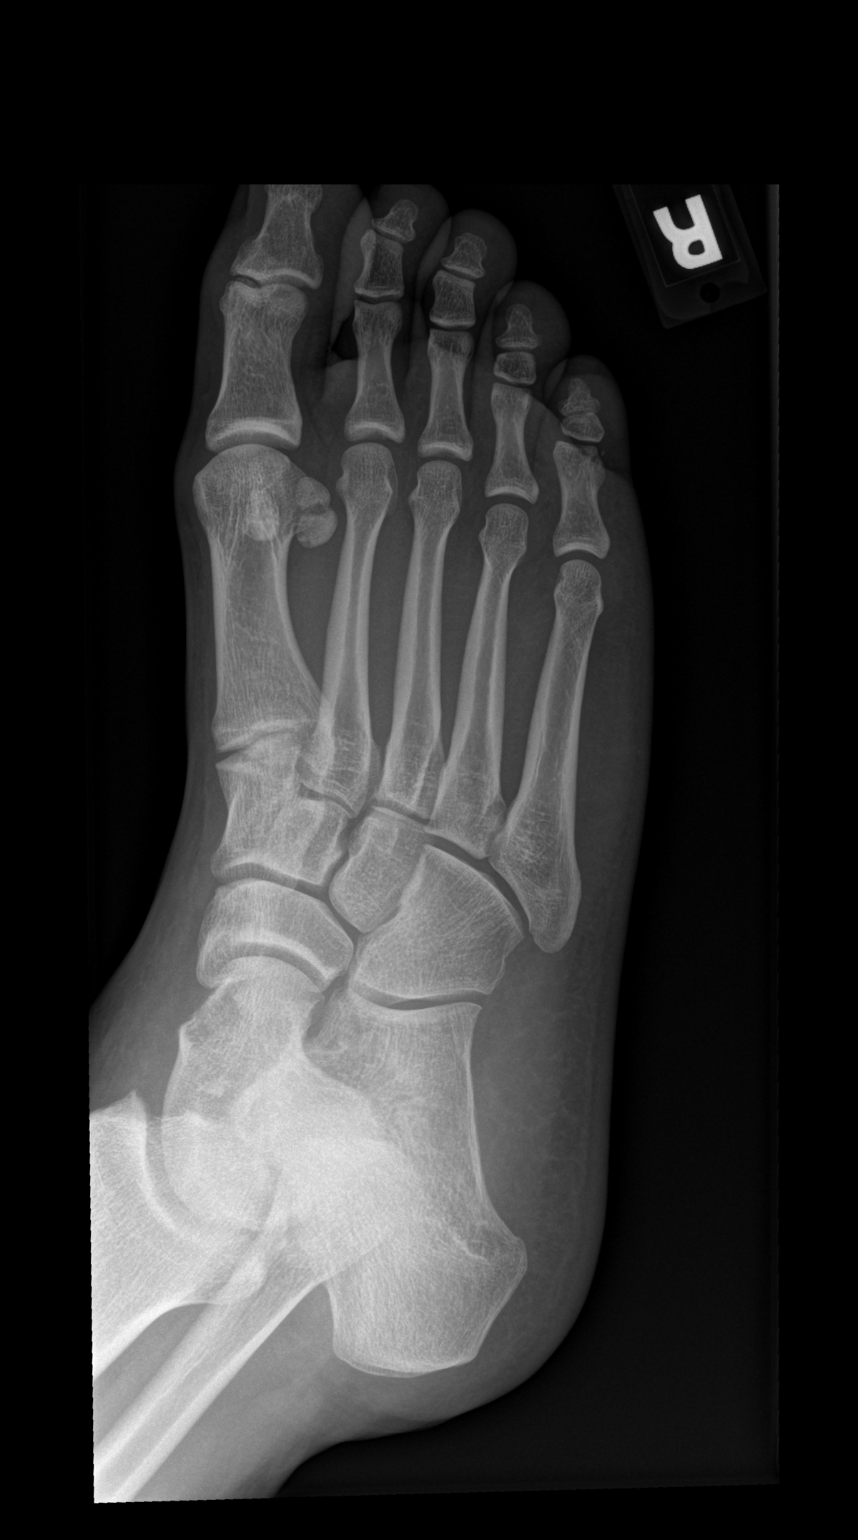

[x foot lat right]
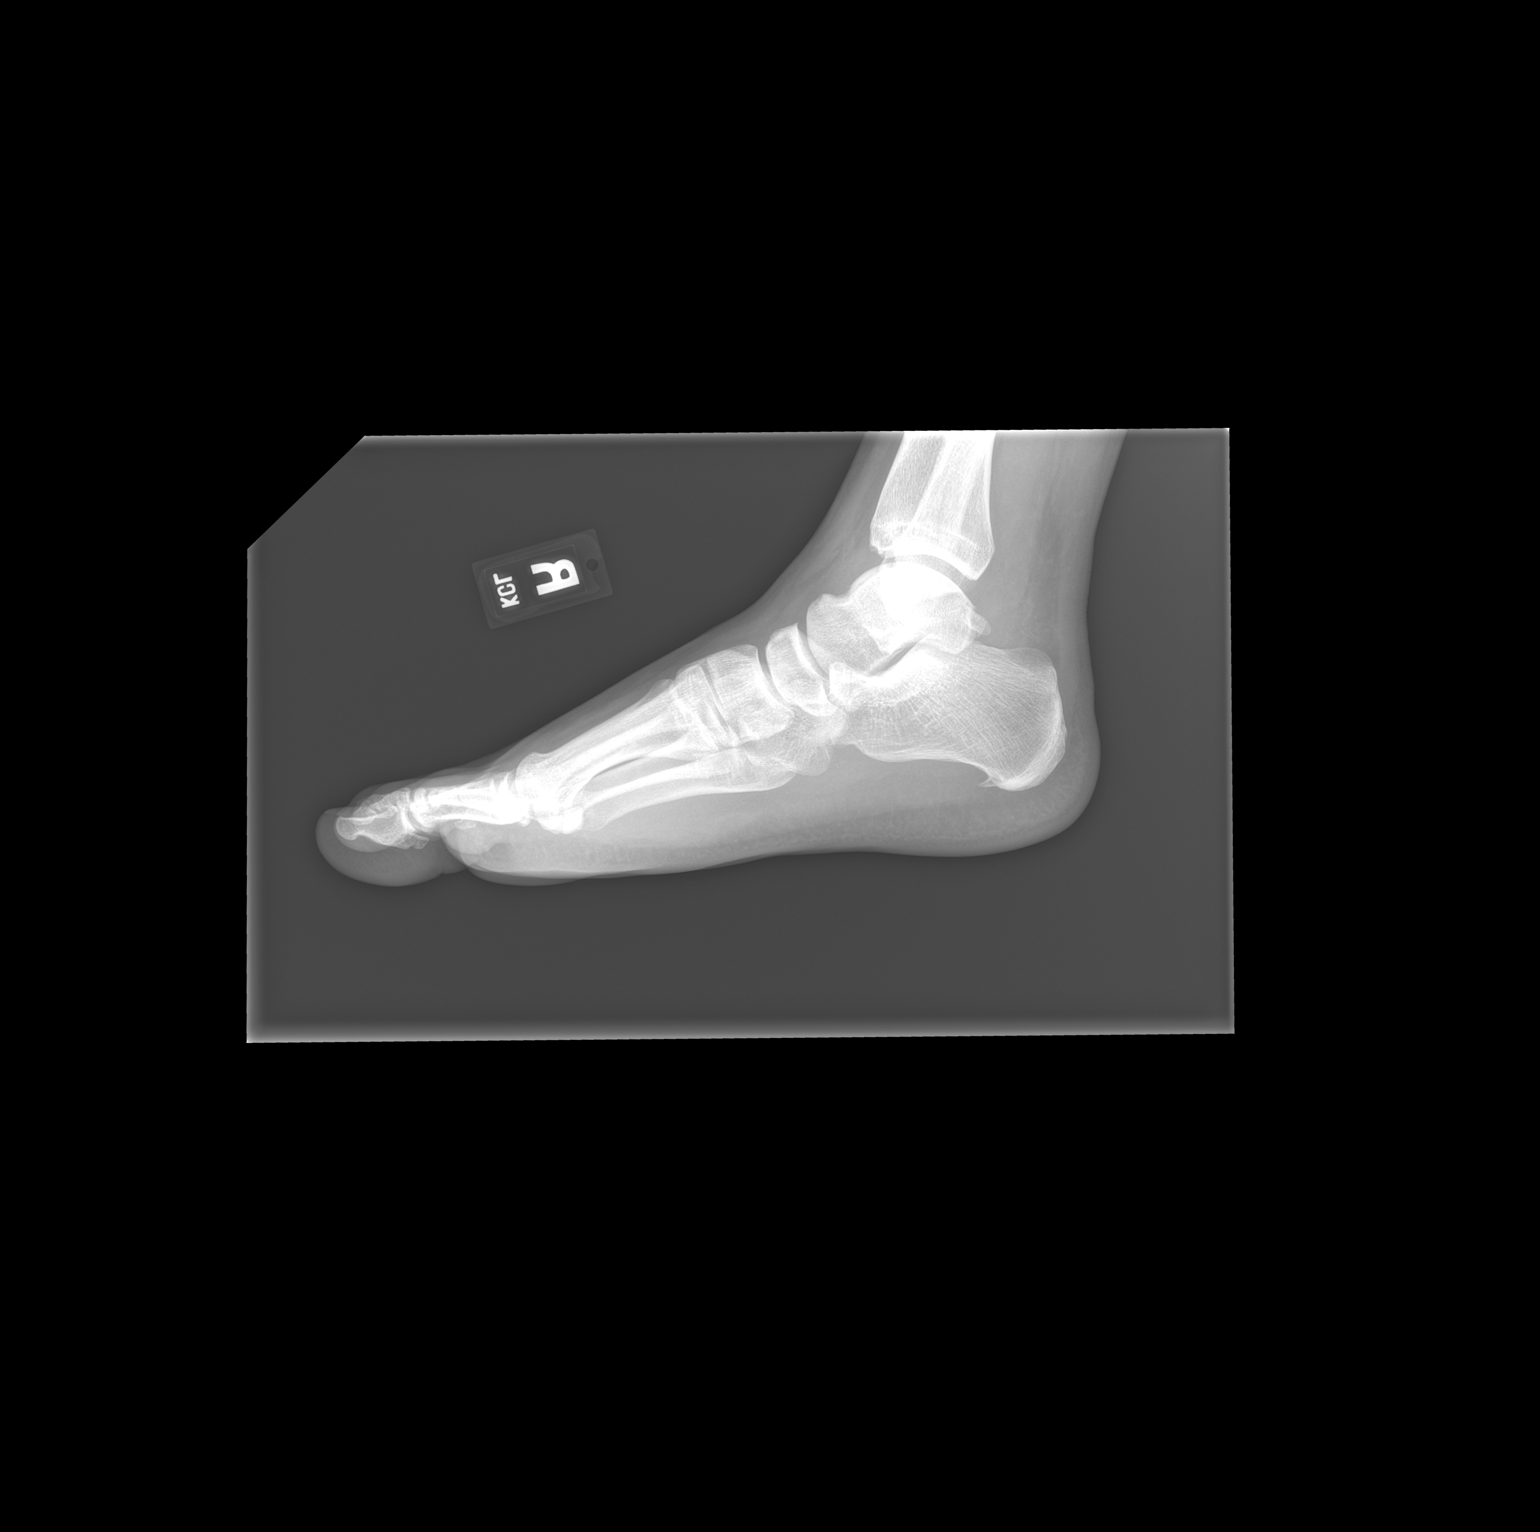

[3 of 3 positions shown; findings below may reference images not displayed]

FINDINGS: Possible subacute or remote avulsion fracture arising from the head
of the proximal phalanx of the fifth toe. No fractures elsewhere.
Well-preserved joint spaces. Well-preserved bone mineral density.
Small plantar calcaneal spur.
IMPRESSION: Possible subacute or remote avulsion fracture arising from the head
of the proximal phalanx of the fifth toe. Please correlate with
point tenderness. No fractures elsewhere. Small plantar calcaneal
spur.
